# Patient Record
Sex: Male | Born: 2000 | Race: Black or African American | Hispanic: No | Marital: Single | Smoking: Former smoker
Health system: Southern US, Community
[De-identification: ages and names within clinical notes are randomized; demographics above are authoritative.]

## PROBLEM LIST (undated history)

## (undated) DIAGNOSIS — J45909 Unspecified asthma, uncomplicated: Secondary | ICD-10-CM

---
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
## 2018-12-28 ENCOUNTER — Emergency Department (HOSPITAL_BASED_OUTPATIENT_CLINIC_OR_DEPARTMENT_OTHER): Payer: No Typology Code available for payment source

## 2018-12-28 ENCOUNTER — Encounter (HOSPITAL_BASED_OUTPATIENT_CLINIC_OR_DEPARTMENT_OTHER): Payer: Self-pay

## 2018-12-28 ENCOUNTER — Emergency Department (HOSPITAL_BASED_OUTPATIENT_CLINIC_OR_DEPARTMENT_OTHER)
Admission: EM | Admit: 2018-12-28 | Discharge: 2018-12-29 | Disposition: A | Discharge status: Home / Self Care | Payer: No Typology Code available for payment source | Attending: Emergency Medicine | Admitting: Emergency Medicine

## 2018-12-28 ENCOUNTER — Other Ambulatory Visit: Payer: Self-pay

## 2018-12-28 DIAGNOSIS — Y999 Unspecified external cause status: Secondary | ICD-10-CM | POA: Insufficient documentation

## 2018-12-28 DIAGNOSIS — Z87891 Personal history of nicotine dependence: Secondary | ICD-10-CM | POA: Insufficient documentation

## 2018-12-28 DIAGNOSIS — S63601A Unspecified sprain of right thumb, initial encounter: Secondary | ICD-10-CM | POA: Diagnosis not present

## 2018-12-28 DIAGNOSIS — Y9389 Activity, other specified: Secondary | ICD-10-CM | POA: Diagnosis not present

## 2018-12-28 DIAGNOSIS — J45909 Unspecified asthma, uncomplicated: Secondary | ICD-10-CM | POA: Diagnosis not present

## 2018-12-28 DIAGNOSIS — S199XXA Unspecified injury of neck, initial encounter: Secondary | ICD-10-CM | POA: Diagnosis present

## 2018-12-28 DIAGNOSIS — Y92410 Unspecified street and highway as the place of occurrence of the external cause: Secondary | ICD-10-CM | POA: Insufficient documentation

## 2018-12-28 DIAGNOSIS — S161XXA Strain of muscle, fascia and tendon at neck level, initial encounter: Secondary | ICD-10-CM | POA: Diagnosis not present

## 2018-12-28 MED ORDER — NAPROXEN 250 MG PO TABS
500.0000 mg | ORAL_TABLET | Freq: Once | ORAL | Status: AC
  Administered 2018-12-28: 500 mg via ORAL
  Filled 2018-12-28: qty 2
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
## 2021-10-18 ENCOUNTER — Encounter (HOSPITAL_BASED_OUTPATIENT_CLINIC_OR_DEPARTMENT_OTHER): Payer: Self-pay | Admitting: Emergency Medicine

## 2021-10-18 ENCOUNTER — Other Ambulatory Visit: Payer: Self-pay

## 2021-10-18 ENCOUNTER — Emergency Department (HOSPITAL_BASED_OUTPATIENT_CLINIC_OR_DEPARTMENT_OTHER)
Admission: EM | Admit: 2021-10-18 | Discharge: 2021-10-18 | Disposition: A | Discharge status: Home / Self Care | Payer: BC Managed Care – PPO | Attending: Emergency Medicine | Admitting: Emergency Medicine

## 2021-10-18 ENCOUNTER — Emergency Department (HOSPITAL_BASED_OUTPATIENT_CLINIC_OR_DEPARTMENT_OTHER): Payer: BC Managed Care – PPO

## 2021-10-18 DIAGNOSIS — Z23 Encounter for immunization: Secondary | ICD-10-CM | POA: Diagnosis not present

## 2021-10-18 DIAGNOSIS — S60512A Abrasion of left hand, initial encounter: Secondary | ICD-10-CM | POA: Diagnosis not present

## 2021-10-18 DIAGNOSIS — S60222A Contusion of left hand, initial encounter: Secondary | ICD-10-CM | POA: Diagnosis not present

## 2021-10-18 DIAGNOSIS — S6992XA Unspecified injury of left wrist, hand and finger(s), initial encounter: Secondary | ICD-10-CM | POA: Diagnosis present

## 2021-10-18 DIAGNOSIS — S6010XA Contusion of unspecified finger with damage to nail, initial encounter: Secondary | ICD-10-CM

## 2021-10-18 DIAGNOSIS — W230XXA Caught, crushed, jammed, or pinched between moving objects, initial encounter: Secondary | ICD-10-CM | POA: Diagnosis not present

## 2021-10-18 DIAGNOSIS — S61309A Unspecified open wound of unspecified finger with damage to nail, initial encounter: Secondary | ICD-10-CM

## 2021-10-18 MED ORDER — NAPROXEN 500 MG PO TABS: 500.0000 mg | ORAL_TABLET | Freq: Two times a day (BID) | ORAL | 0 refills | Status: AC

## 2021-10-18 MED ORDER — TETANUS-DIPHTH-ACELL PERTUSSIS 5-2.5-18.5 LF-MCG/0.5 IM SUSY
0.5000 mL | PREFILLED_SYRINGE | Freq: Once | INTRAMUSCULAR | Status: AC
  Administered 2021-10-18: 0.5 mL via INTRAMUSCULAR
  Filled 2021-10-18: qty 0.5

## 2021-10-18 MED ORDER — CEPHALEXIN 500 MG PO CAPS: 500.0000 mg | ORAL_CAPSULE | Freq: Four times a day (QID) | ORAL | 0 refills | Status: AC

## 2021-10-18 MED ORDER — BACITRACIN ZINC 500 UNIT/GM EX OINT: 1.0000 | TOPICAL_OINTMENT | Freq: Two times a day (BID) | CUTANEOUS | 0 refills | Status: AC

## 2021-10-18 MED ORDER — HYDROCODONE-ACETAMINOPHEN 5-325 MG PO TABS
1.0000 | ORAL_TABLET | Freq: Once | ORAL | Status: AC
  Administered 2021-10-18: 1 via ORAL
  Filled 2021-10-18: qty 1

## 2021-10-18 NOTE — ED Notes (Signed)
Soaking pt's hand in iodine/NS solution.
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
## 2021-10-18 NOTE — Discharge Instructions (Addendum)
Watch for signs of developing infection to include worsening pain, swelling, redness.  Watch for swelling of the finger, pain with any passive extension, tenderness about the base of the finger.  Return to the department in the event of concern for infection.  We will discharge you on oral antibiotics in addition to bacitracin ointment.  Clean with soap and water and change dressings daily. Your fingernail may fall off.

## 2021-10-18 NOTE — ED Notes (Signed)
Patient Alert and oriented to baseline. Stable and ambulatory to baseline. Patient verbalized understanding of the discharge instructions.  Patient belongings were taken by the patient.   

## 2021-11-20 ENCOUNTER — Emergency Department (HOSPITAL_BASED_OUTPATIENT_CLINIC_OR_DEPARTMENT_OTHER)
Admission: EM | Admit: 2021-11-20 | Discharge: 2021-11-20 | Disposition: A | Discharge status: Home / Self Care | Payer: BC Managed Care – PPO | Attending: Emergency Medicine | Admitting: Emergency Medicine

## 2021-11-20 ENCOUNTER — Encounter (HOSPITAL_BASED_OUTPATIENT_CLINIC_OR_DEPARTMENT_OTHER): Payer: Self-pay | Admitting: Emergency Medicine

## 2021-11-20 ENCOUNTER — Other Ambulatory Visit: Payer: Self-pay

## 2021-11-20 DIAGNOSIS — J029 Acute pharyngitis, unspecified: Secondary | ICD-10-CM

## 2021-11-20 DIAGNOSIS — U071 COVID-19: Secondary | ICD-10-CM | POA: Insufficient documentation

## 2021-11-20 LAB — SARS CORONAVIRUS 2 BY RT PCR: SARS Coronavirus 2 by RT PCR: POSITIVE — AB

## 2021-11-20 NOTE — ED Notes (Signed)
Dc instructions reviewed with pt no questions or concerns at this time. Will follow up as needed.  

## 2021-11-20 NOTE — Discharge Instructions (Addendum)
You were seen in the emergency department today for a sore throat.  Your COVID test was positive.  Take Tylenol and ibuprofen for pain and fever.  Drink plenty of fluids.  Do not go back to work until you are completely asymptomatic, no fevers, no pain, no cough, and you are generally feeling well.  Come back to the ER if you have any severe chest pain, shortness of breath, coughing up blood, or any other symptoms concerning to you.

## 2021-11-20 NOTE — ED Triage Notes (Signed)
Pt had sore throat at beginning of this week. Took an at home COVID test that had 2 faint lines and would like to have a confirmation test to see if pt is positive. Pt denies any pain at all today.

## 2021-11-20 NOTE — ED Provider Notes (Signed)
[facility] EMERGENCY DEPARTMENT Provider Note   CSN: [id number] Arrival date & time: 11/20/21  1202     History  Chief Complaint  Patient presents with   Sore Throat    Victor Griffin is a 21 y.o. male. Presenting with sore throat and positive covid test at home.  Patient said he had a sore throat earlier this week that has since resolved and improved.  No cough, no fever, no chills, no body aches, no change of voice, no drooling, no chest pain or shortness of breath.  He took 2 COVID test at home but they were faint lines and wanted to be sure if it was positive or not today with a repeat test.   Sore Throat       Home Medications Prior to Admission medications   Medication Sig Start Date End Date Taking? Authorizing Provider  bacitracin ointment Apply 1 Application topically 2 (two) times daily. 10/18/21   Ernie Avena, MD  cephALEXin (KEFLEX) 500 MG capsule Take 1 capsule (500 mg total) by mouth 4 (four) times daily. 10/18/21   Ernie Avena, MD  naproxen (NAPROSYN) 500 MG tablet Take 1 tablet (500 mg total) by mouth 2 (two) times daily. 10/18/21   Ernie Avena, MD      Allergies    Patient has no known allergies.    Review of Systems   Review of Systems  Physical Exam Updated Vital Signs BP 120/88 (BP Location: Right Arm)   Pulse 66   Temp 98.3 F (36.8 C) (Oral)   Resp 16   Ht 6' (1.829 m)   Wt 63 kg   SpO2 100%   BMI 18.84 kg/m  Physical Exam Constitutional: Alert and oriented. Well appearing and in no distress. Eyes: Conjunctivae are normal. ENT      Head: Normocephalic and atraumatic.      Nose: No congestion.      Mouth/Throat: Mucous membranes are moist.  Mild erythema of the oropharynx, no tonsil hypertrophy, uvula midline, no drooling, no muffled voice      Neck: No stridor.  No tripoding. Cardiovascular: S1, S2,  Normal and symmetric distal pulses are present in all extremities.Warm and well perfused. Respiratory: Normal respiratory  effort. Breath sounds are normal.  O2 sat 100 on RA. Musculoskeletal: Normal range of motion in all extremities. Neurologic: Normal speech and language. No gross focal neurologic deficits are appreciated. Skin: Skin is warm, dry and intact. No rash noted. Psychiatric: Mood and affect are normal. Speech and behavior are normal.  ED Results / Procedures / Treatments   Labs (all labs ordered are listed, but only abnormal results are displayed) Labs Reviewed  SARS CORONAVIRUS 2 BY RT PCR - Abnormal; Notable for the following components:      Result Value   SARS Coronavirus 2 by RT PCR POSITIVE (*)    All other components within normal limits    EKG None  Radiology No results found.  Procedures Procedures    Medications Ordered in ED Medications - No data to display  ED Course/ Medical Decision Making/ A&P                           Medical Decision Making Healthy 21 year old patient with sore throat and positive COVID test at home.  He tested COVID positive here.  Patient's presentation is most consistent with viral pharyngitis.  Low CENTOR criteria, no strep testing pursued..  Given the evident physical exam features,  overall clinical time course, no difficulty handling secretions, or respiratory distress and overall nontoxic appearance, I do not feel that epiglottitis or a more severe soft tissue neck infection is the reason for presentation.  There is no evidence of Ludwig's angina, or other odontogenic or periodontal infection at this time.  Advised isolation precautions, supportive care with Tylenol and ibuprofen and strict return precautions.  He is safe for discharge home and PCP follow-up.     Final Clinical Impression(s) / ED Diagnoses Final diagnoses:  COVID-19  Viral pharyngitis    Rx / DC Orders ED Discharge Orders     None         Mardene Sayer, MD 11/20/21 1545

## 2022-10-03 ENCOUNTER — Emergency Department (HOSPITAL_BASED_OUTPATIENT_CLINIC_OR_DEPARTMENT_OTHER)
Admission: EM | Admit: 2022-10-03 | Discharge: 2022-10-04 | Disposition: A | Discharge status: Home / Self Care | Payer: BC Managed Care – PPO | Attending: Emergency Medicine | Admitting: Emergency Medicine

## 2022-10-03 DIAGNOSIS — L0390 Cellulitis, unspecified: Secondary | ICD-10-CM | POA: Diagnosis not present

## 2022-10-03 DIAGNOSIS — B353 Tinea pedis: Secondary | ICD-10-CM | POA: Diagnosis not present

## 2022-10-03 DIAGNOSIS — L539 Erythematous condition, unspecified: Secondary | ICD-10-CM | POA: Diagnosis present

## 2022-10-04 ENCOUNTER — Other Ambulatory Visit: Payer: Self-pay

## 2022-10-04 ENCOUNTER — Encounter (HOSPITAL_BASED_OUTPATIENT_CLINIC_OR_DEPARTMENT_OTHER): Payer: Self-pay

## 2022-10-04 MED ORDER — SULFAMETHOXAZOLE-TRIMETHOPRIM 800-160 MG PO TABS
1.0000 | ORAL_TABLET | Freq: Once | ORAL | Status: AC
  Administered 2022-10-04: 1 via ORAL
  Filled 2022-10-04: qty 1

## 2022-10-04 MED ORDER — SULFAMETHOXAZOLE-TRIMETHOPRIM 800-160 MG PO TABS: 1.0000 | ORAL_TABLET | Freq: Two times a day (BID) | ORAL | 0 refills | Status: AC

## 2022-10-04 NOTE — ED Provider Notes (Signed)
[place] [facility] Provider Note   CSN: [id number] Arrival date & time: 10/03/22  2353     History  Chief Complaint  Patient presents with   right foot swollen    Victor Griffin is a 22 y.o. male.  The history is provided by the patient.  Rash Location: dorsum of the right foot. Quality: redness   Severity:  Mild Onset quality:  Gradual Duration:  2 days Timing:  Constant Progression:  Unchanged Chronicity:  New Context: not animal contact   Relieved by:  Nothing Worsened by:  Nothing Ineffective treatments:  None tried Associated symptoms: no fever, no throat swelling, no tongue swelling and not wheezing        Home Medications Prior to Admission medications   Medication Sig Start Date End Date Taking? Authorizing Provider  sulfamethoxazole-trimethoprim (BACTRIM DS) 800-160 MG tablet Take 1 tablet by mouth 2 (two) times daily for 7 days. 10/04/22 10/11/22 Yes Keigo Whalley, MD  bacitracin ointment Apply 1 Application topically 2 (two) times daily. 10/18/21   Ernie Avena, MD  cephALEXin (KEFLEX) 500 MG capsule Take 1 capsule (500 mg total) by mouth 4 (four) times daily. 10/18/21   Ernie Avena, MD  naproxen (NAPROSYN) 500 MG tablet Take 1 tablet (500 mg total) by mouth 2 (two) times daily. 10/18/21   Ernie Avena, MD      Allergies    Patient has no known allergies.    Review of Systems   Review of Systems  Constitutional:  Negative for fever.  HENT:  Negative for facial swelling.   Respiratory:  Negative for wheezing and stridor.   Skin:  Positive for rash.  All other systems reviewed and are negative.   Physical Exam Updated Vital Signs BP 127/79   Pulse 81   Temp 98.4 F (36.9 C) (Oral)   Resp 20   Ht 6' (1.829 m)   Wt 63 kg   SpO2 98%   BMI 18.84 kg/m  Physical Exam Vitals and nursing note reviewed.  Constitutional:      General: He is not in acute distress.    Appearance: Normal appearance. He is  well-developed. He is not diaphoretic.  HENT:     Head: Normocephalic and atraumatic.     Nose: Nose normal.  Eyes:     Conjunctiva/sclera: Conjunctivae normal.     Pupils: Pupils are equal, round, and reactive to light.  Cardiovascular:     Rate and Rhythm: Normal rate and regular rhythm.  Pulmonary:     Effort: Pulmonary effort is normal.     Breath sounds: Normal breath sounds. No wheezing or rales.  Abdominal:     General: Bowel sounds are normal.     Palpations: Abdomen is soft.     Tenderness: There is no abdominal tenderness. There is no guarding or rebound.  Musculoskeletal:        General: Normal range of motion.     Cervical back: Normal range of motion and neck supple.  Skin:    General: Skin is warm and dry.     Capillary Refill: Capillary refill takes less than 2 seconds.       Neurological:     General: No focal deficit present.     Mental Status: He is alert and oriented to person, place, and time.     Deep Tendon Reflexes: Reflexes normal.  Psychiatric:        Mood and Affect: Mood normal.     ED  Results / Procedures / Treatments   Labs (all labs ordered are listed, but only abnormal results are displayed) Labs Reviewed - No data to display  EKG None  Radiology No results found.  Procedures Procedures    Medications Ordered in ED Medications  sulfamethoxazole-trimethoprim (BACTRIM DS) 800-160 MG per tablet 1 tablet (has no administration in time range)    ED Course/ Medical Decision Making/ A&P                             Medical Decision Making Patient with redness of the dorsum of the right foot   Problems Addressed: Cellulitis, unspecified cellulitis site:    Details: Bactrim x 7 days  Tinea pedis of both feet:    Details: Start foot spray   Amount and/or Complexity of Data Reviewed External Data Reviewed: notes.    Details: Previous notes reviewed   Risk Prescription drug management. Risk Details: Well appearing, small area,  secondary to athlete's foot, start athlete's foot medication and antibiotics.  Stable for discharge.  Strict return     Final Clinical Impression(s) / ED Diagnoses Final diagnoses:  Cellulitis, unspecified cellulitis site  Tinea pedis of both feet   Return for intractable cough, coughing up blood, fevers > 100.4 unrelieved by medication, shortness of breath, intractable vomiting, chest pain, shortness of breath, weakness, numbness, changes in speech, facial asymmetry, abdominal pain, passing out, Inability to tolerate liquids or food, cough, altered mental status or any concerns. No signs of systemic illness or infection. The patient is nontoxic-appearing on exam and vital signs are within normal limits.  I have reviewed the triage vital signs and the nursing notes. Pertinent labs & imaging results that were available during my care of the patient were reviewed by me and considered in my medical decision making (see chart for details). After history, exam, and medical workup I feel the patient has been appropriately medically screened and is safe for discharge home. Pertinent diagnoses were discussed with the patient. Patient was given return precautions. Rx / DC Orders ED Discharge Orders          Ordered    sulfamethoxazole-trimethoprim (BACTRIM DS) 800-160 MG tablet  2 times daily        10/04/22 0017              Chesnie Capell, MD 10/04/22 1610

## 2022-10-04 NOTE — ED Triage Notes (Signed)
Pt has right foot swelling and redness that started two days ago. No injury to foot. Pt is ambulatory. Pulse palpable. Pain is 5/10.

## 2022-12-21 ENCOUNTER — Encounter (HOSPITAL_BASED_OUTPATIENT_CLINIC_OR_DEPARTMENT_OTHER): Payer: Self-pay | Admitting: Emergency Medicine

## 2022-12-21 ENCOUNTER — Emergency Department (HOSPITAL_BASED_OUTPATIENT_CLINIC_OR_DEPARTMENT_OTHER)
Admission: EM | Admit: 2022-12-21 | Discharge: 2022-12-21 | Disposition: A | Discharge status: Home / Self Care | Payer: BC Managed Care – PPO | Attending: Emergency Medicine | Admitting: Emergency Medicine

## 2022-12-21 ENCOUNTER — Other Ambulatory Visit: Payer: Self-pay

## 2022-12-21 DIAGNOSIS — H1032 Unspecified acute conjunctivitis, left eye: Secondary | ICD-10-CM | POA: Insufficient documentation

## 2022-12-21 DIAGNOSIS — H5789 Other specified disorders of eye and adnexa: Secondary | ICD-10-CM | POA: Diagnosis present

## 2022-12-21 MED ORDER — TOBRAMYCIN 0.3 % OP SOLN: 1.0000 [drp] | OPHTHALMIC | 0 refills | Status: AC

## 2022-12-21 NOTE — Discharge Instructions (Addendum)
Cool compresses.  Return if any problems.

## 2022-12-21 NOTE — ED Provider Notes (Signed)
[place] [facility] Provider Note   CSN: [id number] Arrival date & time: 12/21/22  1804     History  Chief Complaint  Patient presents with   Eye Problem    Victor Griffin is a 22 y.o. male.  Pt complains of left eye redness.  Pt reports symptoms began on Friday.  No known injury.  Pt reports vision is unchanged   The history is provided by the patient. No language interpreter was used.  Eye Problem Location:  Left eye Quality:  Aching Severity:  Moderate Onset quality:  Gradual Timing:  Constant Progression:  Worsening Relieved by:  Nothing Worsened by:  Nothing Ineffective treatments:  None tried Associated symptoms: crusting and itching        Home Medications Prior to Admission medications   Medication Sig Start Date End Date Taking? Authorizing Provider  bacitracin ointment Apply 1 Application topically 2 (two) times daily. 10/18/21   Ernie Avena, MD  cephALEXin (KEFLEX) 500 MG capsule Take 1 capsule (500 mg total) by mouth 4 (four) times daily. 10/18/21   Ernie Avena, MD  naproxen (NAPROSYN) 500 MG tablet Take 1 tablet (500 mg total) by mouth 2 (two) times daily. 10/18/21   Ernie Avena, MD      Allergies    Patient has no known allergies.    Review of Systems   Review of Systems  Eyes:  Positive for itching.  All other systems reviewed and are negative.   Physical Exam Updated Vital Signs BP (!) 128/98   Pulse 81   Temp 98.3 F (36.8 C)   Resp 18   Ht 6' (1.829 m)   Wt 63 kg   SpO2 100%   BMI 18.82 kg/m  Physical Exam Vitals and nursing note reviewed.  Constitutional:      Appearance: He is well-developed.  HENT:     Head: Normocephalic.     Nose: Nose normal.  Eyes:     Extraocular Movements: Extraocular movements intact.     Pupils: Pupils are equal, round, and reactive to light.     Comments: Left eye injected,  crusting corners   Cardiovascular:     Rate and Rhythm: Normal rate.   Pulmonary:     Effort: Pulmonary effort is normal.  Abdominal:     General: There is no distension.  Musculoskeletal:        General: Normal range of motion.  Skin:    General: Skin is warm.  Neurological:     General: No focal deficit present.     Mental Status: He is alert and oriented to person, place, and time.  Psychiatric:        Mood and Affect: Mood normal.     ED Results / Procedures / Treatments   Labs (all labs ordered are listed, but only abnormal results are displayed) Labs Reviewed - No data to display  EKG None  Radiology No results found.  Procedures Procedures    Medications Ordered in ED Medications - No data to display  ED Course/ Medical Decision Making/ A&P                                 Medical Decision Making Pt complains of redness left eye.   Risk Prescription drug management. Risk Details: Pt counseled on conjunctivitis  Pt advised cool compresses.  Follow up with opthalmology for recheck  Final Clinical Impression(s) / ED Diagnoses Final diagnoses:  Acute conjunctivitis of left eye, unspecified acute conjunctivitis type    Rx / DC Orders ED Discharge Orders          Ordered    tobramycin (TOBREX) 0.3 % ophthalmic solution  Every 4 hours        12/21/22 1944           An After Visit Summary was printed and given to the patient.    Elson Areas, Cordelia Poche 12/21/22 1945    Charlynne Pander, MD 12/21/22 [phone]

## 2022-12-21 NOTE — ED Triage Notes (Signed)
Pt to ER with c/o left eye redness and watering since Friday.  Denies crusting of drainage.  Denies injury.
